# Patient Record
Sex: Male | Born: 1980 | Race: White | Hispanic: No | Marital: Married | Smoking: Current every day smoker
Health system: Southern US, Community
[De-identification: ages and names within clinical notes are randomized; demographics above are authoritative.]

## PROBLEM LIST (undated history)

## (undated) HISTORY — PX: TONSILLECTOMY AND ADENOIDECTOMY: SHX28

---

## 2002-04-14 ENCOUNTER — Emergency Department (HOSPITAL_COMMUNITY): Admission: EM | Admit: 2002-04-14 | Discharge: 2002-04-14 | Payer: Self-pay | Admitting: Emergency Medicine

## 2002-04-14 ENCOUNTER — Emergency Department (HOSPITAL_COMMUNITY): Admission: EM | Admit: 2002-04-14 | Discharge: 2002-04-14 | Payer: Self-pay | Admitting: *Deleted

## 2002-04-14 ENCOUNTER — Encounter: Payer: Self-pay | Admitting: Emergency Medicine

## 2002-04-17 ENCOUNTER — Emergency Department (HOSPITAL_COMMUNITY): Admission: EM | Admit: 2002-04-17 | Discharge: 2002-04-17 | Payer: Self-pay | Admitting: Emergency Medicine

## 2004-05-20 ENCOUNTER — Emergency Department: Payer: Self-pay | Admitting: Emergency Medicine

## 2006-11-23 ENCOUNTER — Emergency Department (HOSPITAL_COMMUNITY): Admission: EM | Admit: 2006-11-23 | Discharge: 2006-11-23 | Payer: Self-pay | Admitting: Emergency Medicine

## 2008-06-17 ENCOUNTER — Emergency Department: Payer: Self-pay | Admitting: Emergency Medicine

## 2013-10-17 ENCOUNTER — Emergency Department: Payer: Self-pay | Admitting: Emergency Medicine

## 2014-03-12 ENCOUNTER — Emergency Department: Payer: Self-pay | Admitting: Emergency Medicine

## 2014-09-01 ENCOUNTER — Emergency Department: Payer: Self-pay | Admitting: Emergency Medicine

## 2014-11-16 ENCOUNTER — Emergency Department: Admit: 2014-11-16 | Discharge status: Against Medical Advice | Payer: Self-pay | Admitting: Emergency Medicine

## 2017-09-19 ENCOUNTER — Emergency Department
Admission: EM | Admit: 2017-09-19 | Discharge: 2017-09-19 | Disposition: A | Discharge status: Home / Self Care | Payer: Self-pay | Attending: Emergency Medicine | Admitting: Emergency Medicine

## 2017-09-19 ENCOUNTER — Encounter: Payer: Self-pay | Admitting: Emergency Medicine

## 2017-09-19 DIAGNOSIS — K0889 Other specified disorders of teeth and supporting structures: Secondary | ICD-10-CM

## 2017-09-19 DIAGNOSIS — F17210 Nicotine dependence, cigarettes, uncomplicated: Secondary | ICD-10-CM | POA: Insufficient documentation

## 2017-09-19 DIAGNOSIS — K047 Periapical abscess without sinus: Secondary | ICD-10-CM | POA: Insufficient documentation

## 2017-09-19 MED ORDER — AMOXICILLIN 500 MG PO CAPS
1000.0000 mg | ORAL_CAPSULE | Freq: Once | ORAL | Status: AC
  Administered 2017-09-19: 1000 mg via ORAL
  Filled 2017-09-19: qty 2

## 2017-09-19 MED ORDER — IBUPROFEN 800 MG PO TABS
800.0000 mg | ORAL_TABLET | Freq: Once | ORAL | Status: AC
Start: 2017-09-19 — End: 2017-09-19
  Administered 2017-09-19: 800 mg via ORAL
  Filled 2017-09-19: qty 1

## 2017-09-19 MED ORDER — IBUPROFEN 600 MG PO TABS: 600.0000 mg | ORAL_TABLET | Freq: Four times a day (QID) | ORAL | 0 refills | Status: AC | PRN

## 2017-09-19 MED ORDER — AMOXICILLIN 875 MG PO TABS: 875.0000 mg | ORAL_TABLET | Freq: Two times a day (BID) | ORAL | 0 refills | Status: AC

## 2017-09-19 MED ORDER — TRAMADOL HCL 50 MG PO TABS: 50.0000 mg | ORAL_TABLET | Freq: Four times a day (QID) | ORAL | 0 refills | Status: AC | PRN

## 2017-09-19 MED ORDER — OXYCODONE-ACETAMINOPHEN 5-325 MG PO TABS
1.0000 | ORAL_TABLET | Freq: Once | ORAL | Status: AC
  Administered 2017-09-19: 1 via ORAL
  Filled 2017-09-19: qty 1

## 2017-09-19 NOTE — Discharge Instructions (Signed)
Please take antibiotics as prescribed.  Take ibuprofen and Tylenol for mild to moderate pain and tramadol as needed for severe pain.  Return to the ED for any fevers increasing pain, drainage worsening symptoms or urgent changes in health.  Please follow-up with dental clinic.

## 2017-09-19 NOTE — ED Provider Notes (Signed)
[facility] EMERGENCY DEPARTMENT Provider Note   CSN: [id number] Arrival date & time: 09/19/17  1315     History   Chief Complaint Chief Complaint  Patient presents with  . Dental Pain    HPI Ronald Stewart is a 37 y.o. male presents to the emergency department for evaluation of dental pain.  Dental pain is been present for 2 days.  No trauma or injury.  Patient states he has a cracked tooth and has a history of dental caries.  Pain and swelling started 2 days ago.  No difficulty swallowing, no fevers.  He has not been on any antibiotics or taking any medication for pain.  Pain is currently moderate.  HPI  History reviewed. No pertinent past medical history.  There are no active problems to display for this patient.   Past Surgical History:  Procedure Laterality Date  . TONSILLECTOMY AND ADENOIDECTOMY         Home Medications    Prior to Admission medications   Medication Sig Start Date End Date Taking? Authorizing Provider  amoxicillin (AMOXIL) 875 MG tablet Take 1 tablet (875 mg total) by mouth 2 (two) times daily. X 10 days 09/19/17   Evon Slack, PA-C  ibuprofen (ADVIL,MOTRIN) 600 MG tablet Take 1 tablet (600 mg total) by mouth every 6 (six) hours as needed for moderate pain. 09/19/17   Evon Slack, PA-C  traMADol (ULTRAM) 50 MG tablet Take 1-2 tablets (50-100 mg total) by mouth every 6 (six) hours as needed. 09/19/17 09/19/18  Evon Slack, PA-C    Family History No family history on file.  Social History Social History   Tobacco Use  . Smoking status: Current Every Day Smoker    Packs/day: 1.00    Types: Cigarettes  . Smokeless tobacco: Never Used  Substance Use Topics  . Alcohol use: No    Frequency: Never  . Drug use: No     Allergies   Patient has no known allergies.   Review of Systems Review of Systems  Constitutional: Negative.  Negative for chills and fever.  HENT: Positive for dental problem and facial  swelling. Negative for drooling, mouth sores, trouble swallowing and voice change.   Respiratory: Negative for shortness of breath.   Cardiovascular: Negative for chest pain.  Gastrointestinal: Negative for nausea and vomiting.  Skin: Negative.   All other systems reviewed and are negative.    Physical Exam Updated Vital Signs BP (!) 146/86 (BP Location: Left Arm)   Pulse (!) 104   Temp 97.8 F (36.6 C) (Oral)   Resp 17   Ht 6\' 4"  (1.93 m)   Wt 79.4 kg (175 lb)   SpO2 98%   BMI 21.30 kg/m   Physical Exam  Constitutional: He is oriented to person, place, and time. He appears well-developed and well-nourished. No distress.  HENT:  Head: Normocephalic and atraumatic.  Right Ear: External ear normal.  Left Ear: External ear normal.  Nose: Nose normal.  Mouth/Throat: Uvula is midline and oropharynx is clear and moist. No oral lesions. No trismus in the jaw. Normal dentition. Dental caries present. No dental abscesses or uvula swelling.    Eyes: EOM are normal.  Neck: Normal range of motion. Neck supple.  Cardiovascular: Normal rate. Exam reveals no gallop and no friction rub.  No murmur heard. Pulmonary/Chest: Effort normal and breath sounds normal. No respiratory distress.  Neurological: He is alert and oriented to person, place, and time.  Skin: Skin  is warm and dry.  Psychiatric: He has a normal mood and affect. His behavior is normal. Thought content normal.     ED Treatments / Results  Labs (all labs ordered are listed, but only abnormal results are displayed) Labs Reviewed - No data to display  EKG  EKG Interpretation None       Radiology No results found.  Procedures Procedures (including critical care time)  Medications Ordered in ED Medications  oxyCODONE-acetaminophen (PERCOCET/ROXICET) 5-325 MG per tablet 1 tablet (1 tablet Oral Given 09/19/17 1431)  ibuprofen (ADVIL,MOTRIN) tablet 800 mg (800 mg Oral Given 09/19/17 1431)  amoxicillin (AMOXIL)  capsule 1,000 mg (1,000 mg Oral Given 09/19/17 1430)     Initial Impression / Assessment and Plan / ED Course  I have reviewed the triage vital signs and the nursing notes.  Pertinent labs & imaging results that were available during my care of the patient were reviewed by me and considered in my medical decision making (see chart for details).     37 year old with dental pain, dental infection.  Started on antibiotics and given tramadol and ibuprofen for pain.  He will follow with dental clinic.  He is educated on signs and symptoms to return to the ED for.  Final Clinical Impressions(s) / ED Diagnoses   Final diagnoses:  Dental infection  Pain, dental    ED Discharge Orders        Ordered    amoxicillin (AMOXIL) 875 MG tablet  2 times daily     09/19/17 1501    traMADol (ULTRAM) 50 MG tablet  Every 6 hours PRN     09/19/17 1501    ibuprofen (ADVIL,MOTRIN) 600 MG tablet  Every 6 hours PRN     09/19/17 1501       Ronnette JuniperGaines, Thomas C, PA-C 09/19/17 1505    Governor RooksLord, Rebecca, MD 09/19/17 1520

## 2017-09-19 NOTE — ED Triage Notes (Signed)
Patient comes into the ED via POV c/o dental pain on the right upper and lower sides x 2 days.  Patient in NAD at this time.

## 2017-09-19 NOTE — ED Notes (Signed)
See triage note  States he is having pain with some jaw swelling to right for last last few days . States he thinks it is his wisdom teeth

## 2021-07-31 ENCOUNTER — Emergency Department: Payer: Self-pay

## 2021-07-31 ENCOUNTER — Encounter: Payer: Self-pay | Admitting: *Deleted

## 2021-07-31 ENCOUNTER — Emergency Department
Admission: EM | Admit: 2021-07-31 | Discharge: 2021-07-31 | Disposition: A | Discharge status: Home / Self Care | Payer: Self-pay | Attending: Emergency Medicine | Admitting: Emergency Medicine

## 2021-07-31 ENCOUNTER — Other Ambulatory Visit: Payer: Self-pay

## 2021-07-31 DIAGNOSIS — R519 Headache, unspecified: Secondary | ICD-10-CM | POA: Insufficient documentation

## 2021-07-31 DIAGNOSIS — Y900 Blood alcohol level of less than 20 mg/100 ml: Secondary | ICD-10-CM | POA: Insufficient documentation

## 2021-07-31 DIAGNOSIS — J690 Pneumonitis due to inhalation of food and vomit: Secondary | ICD-10-CM | POA: Insufficient documentation

## 2021-07-31 DIAGNOSIS — Z20822 Contact with and (suspected) exposure to covid-19: Secondary | ICD-10-CM | POA: Insufficient documentation

## 2021-07-31 DIAGNOSIS — T401X1A Poisoning by heroin, accidental (unintentional), initial encounter: Secondary | ICD-10-CM | POA: Insufficient documentation

## 2021-07-31 LAB — COMPREHENSIVE METABOLIC PANEL
ALT: 23 U/L (ref 0–44)
AST: 41 U/L (ref 15–41)
Albumin: 3.7 g/dL (ref 3.5–5.0)
Alkaline Phosphatase: 51 U/L (ref 38–126)
Anion gap: 10 (ref 5–15)
BUN: 13 mg/dL (ref 6–20)
CO2: 25 mmol/L (ref 22–32)
Calcium: 8.2 mg/dL — ABNORMAL LOW (ref 8.9–10.3)
Chloride: 104 mmol/L (ref 98–111)
Creatinine, Ser: 1.6 mg/dL — ABNORMAL HIGH (ref 0.61–1.24)
GFR, Estimated: 56 mL/min — ABNORMAL LOW (ref >60)
Glucose, Bld: 244 mg/dL — ABNORMAL HIGH (ref 70–99)
Potassium: 3.9 mmol/L (ref 3.5–5.1)
Sodium: 139 mmol/L (ref 135–145)
Total Bilirubin: 1 mg/dL (ref 0.3–1.2)
Total Protein: 5.8 g/dL — ABNORMAL LOW (ref 6.5–8.1)

## 2021-07-31 LAB — CBC WITH DIFFERENTIAL/PLATELET
Abs Immature Granulocytes: 0.52 10*3/uL — ABNORMAL HIGH (ref 0.00–0.07)
Basophils Absolute: 0.1 10*3/uL (ref 0.0–0.1)
Basophils Relative: 0 %
Eosinophils Absolute: 0.1 10*3/uL (ref 0.0–0.5)
Eosinophils Relative: 0 %
HCT: 51 % (ref 39.0–52.0)
Hemoglobin: 17 g/dL (ref 13.0–17.0)
Immature Granulocytes: 2 %
Lymphocytes Relative: 3 %
Lymphs Abs: 1 10*3/uL (ref 0.7–4.0)
MCH: 29.6 pg (ref 26.0–34.0)
MCHC: 33.3 g/dL (ref 30.0–36.0)
MCV: 88.7 fL (ref 80.0–100.0)
Monocytes Absolute: 1.4 10*3/uL — ABNORMAL HIGH (ref 0.1–1.0)
Monocytes Relative: 5 %
Neutro Abs: 27.4 10*3/uL — ABNORMAL HIGH (ref 1.7–7.7)
Neutrophils Relative %: 90 %
Platelets: 199 10*3/uL (ref 150–400)
RBC: 5.75 MIL/uL (ref 4.22–5.81)
RDW: 13.1 % (ref 11.5–15.5)
Smear Review: NORMAL
WBC: 30.6 10*3/uL — ABNORMAL HIGH (ref 4.0–10.5)
nRBC: 0 % (ref 0.0–0.2)

## 2021-07-31 LAB — RESP PANEL BY RT-PCR (FLU A&B, COVID) ARPGX2
Influenza A by PCR: NEGATIVE
Influenza B by PCR: NEGATIVE
SARS Coronavirus 2 by RT PCR: NEGATIVE

## 2021-07-31 LAB — PROCALCITONIN: Procalcitonin: 0.12 ng/mL

## 2021-07-31 LAB — ETHANOL: Alcohol, Ethyl (B): <10 mg/dL (ref <10)

## 2021-07-31 LAB — LACTIC ACID, PLASMA
Lactic Acid, Venous: 1.5 mmol/L (ref 0.5–1.9)
Lactic Acid, Venous: 1.1 mmol/L (ref 0.5–1.9)

## 2021-07-31 MED ORDER — SODIUM CHLORIDE 0.9 % IV BOLUS
1000.0000 mL | Freq: Once | INTRAVENOUS | Status: AC
  Administered 2021-07-31: 1000 mL via INTRAVENOUS

## 2021-07-31 MED ORDER — NALOXONE HCL 4 MG/0.1ML NA LIQD: 1.0000 | Freq: Once | NASAL | 0 refills | Status: AC

## 2021-07-31 MED ORDER — SODIUM CHLORIDE 0.9 % IV SOLN
3.0000 g | Freq: Once | INTRAVENOUS | Status: AC
  Administered 2021-07-31: 3 g via INTRAVENOUS
  Filled 2021-07-31: qty 8

## 2021-07-31 MED ORDER — AMOXICILLIN-POT CLAVULANATE 875-125 MG PO TABS: 1.0000 | ORAL_TABLET | Freq: Two times a day (BID) | ORAL | 0 refills | Status: AC

## 2021-07-31 MED ORDER — ONDANSETRON HCL 4 MG/2ML IJ SOLN
4.0000 mg | Freq: Once | INTRAMUSCULAR | Status: AC
  Administered 2021-07-31: 4 mg via INTRAVENOUS
  Filled 2021-07-31: qty 2

## 2021-07-31 MED ORDER — KETOROLAC TROMETHAMINE 30 MG/ML IJ SOLN
15.0000 mg | Freq: Once | INTRAMUSCULAR | Status: AC
  Administered 2021-07-31: 15 mg via INTRAVENOUS
  Filled 2021-07-31: qty 1

## 2021-07-31 NOTE — Progress Notes (Signed)
PHARMACY -  BRIEF ANTIBIOTIC NOTE   Pharmacy has received consult(s) for Unasyn from an ED provider.  The patient's profile has been reviewed for ht/wt/allergies/indication/available labs.    One time order(s) placed for Unasyn 3 gm for aspiration pneumonia  Further antibiotics/pharmacy consults should be ordered by admitting physician if indicated.                       Thank you, Renda Rolls, PharmD, [facility] 07/31/2021 5:38 AM

## 2021-07-31 NOTE — ED Provider Notes (Signed)
[facility] Provider Note    Event Date/Time   First MD Initiated Contact with Patient 07/31/21 0157     (approximate)   History   Drug Overdose   HPI  Ronald Stewart is a 41 y.o. male brought to the ED via EMS from home status post accidental overdose.  Wife found patient with agonal respirations status post snorting heroin.  Narcan was given on scene with good effect.  Patient arrives to the ED awake, alert, asking for his wife and his mama.  Complains of headache.  Denies chest pain, shortness of breath, abdominal pain, nausea or vomiting.     Past Medical History  No past medical history on file.   Active Problem List  There are no problems to display for this patient.    Past Surgical History   Past Surgical History:  Procedure Laterality Date   TONSILLECTOMY AND ADENOIDECTOMY       Home Medications   Prior to Admission medications   Medication Sig Start Date End Date Taking? Authorizing Provider  amoxicillin-clavulanate (AUGMENTIN) 875-125 MG tablet Take 1 tablet by mouth 2 (two) times daily. 07/31/21  Yes Irean Hong, MD  naloxone [facility]) nasal spray 4 mg/0.1 mL Place 1 spray into the nose once for 1 dose. 07/31/21 07/31/21 Yes Irean Hong, MD  amoxicillin (AMOXIL) 875 MG tablet Take 1 tablet (875 mg total) by mouth 2 (two) times daily. X 10 days 09/19/17   Evon Slack, PA-C  ibuprofen (ADVIL,MOTRIN) 600 MG tablet Take 1 tablet (600 mg total) by mouth every 6 (six) hours as needed for moderate pain. 09/19/17   Evon Slack, PA-C     Allergies  Patient has no known allergies.   Family History  No family history on file.   Physical Exam  Triage Vital Signs: ED Triage Vitals  Enc Vitals Group     BP      Pulse      Resp      Temp      Temp src      SpO2      Weight      Height      Head Circumference      Peak Flow      Pain Score      Pain Loc      Pain Edu?      Excl. in GC?     Updated Vital Signs: BP  109/72    Pulse 83    Temp 98 F (36.7 C) (Oral)    Resp (!) 21    Ht 6\' 4"  (1.93 m)    Wt 77.1 kg    SpO2 99%    BMI 20.69 kg/m    General: Awake, no distress.  CV:  Good peripheral perfusion.  Resp:  Normal effort.  CTA B. Abd:  No distention.  Nontender to palpation. Other:  Awake, alert, conversant.  CN II toXII grossly intact. MAEx4.   ED Results / Procedures / Treatments  Labs (all labs ordered are listed, but only abnormal results are displayed) Labs Reviewed  CBC WITH DIFFERENTIAL/PLATELET - Abnormal; Notable for the following components:      Result Value   WBC 30.6 (*)    Neutro Abs 27.4 (*)    Monocytes Absolute 1.4 (*)    Abs Immature Granulocytes 0.52 (*)    All other components within normal limits  COMPREHENSIVE METABOLIC PANEL - Abnormal; Notable for the following components:  Glucose, Bld 244 (*)    Creatinine, Ser 1.60 (*)    Calcium 8.2 (*)    Total Protein 5.8 (*)    GFR, Estimated 56 (*)    All other components within normal limits  RESP PANEL BY RT-PCR (FLU A&B, COVID) ARPGX2  CULTURE, BLOOD (ROUTINE X 2)  CULTURE, BLOOD (ROUTINE X 2)  ETHANOL  PROCALCITONIN  LACTIC ACID, PLASMA  URINE DRUG SCREEN, QUALITATIVE ([facility] ONLY)  URINALYSIS, ROUTINE W REFLEX MICROSCOPIC  LACTIC ACID, PLASMA     EKG  ED ECG REPORT I, Aniko Finnigan J, the attending physician, personally viewed and interpreted this ECG.   Date: 07/31/2021  EKG Time: 0200  Rate: 102  Rhythm: sinus tachycardia  Axis: Normal  Intervals:none  ST&T Change: Nonspecific    RADIOLOGY I have personally reviewed patient's CT head as well as chest x-ray as well as the radiology interpretation:  Chest x-ray: No acute cardiopulmonary process  CT head: No ICH  Official radiology report(s): CT Head Wo Contrast  Result Date: 07/31/2021 CLINICAL DATA:  Mental status change, unknown cause snorting heroin, overdose, ha EXAM: CT HEAD WITHOUT CONTRAST TECHNIQUE: Contiguous axial images were  obtained from the base of the skull through the vertex without intravenous contrast. RADIATION DOSE REDUCTION: This exam was performed according to the departmental dose-optimization program which includes automated exposure control, adjustment of the mA and/or kV according to patient size and/or use of iterative reconstruction technique. COMPARISON:  None. FINDINGS: Brain: No acute intracranial abnormality. Specifically, no hemorrhage, hydrocephalus, mass lesion, acute infarction, or significant intracranial injury. Vascular: No hyperdense vessel or unexpected calcification. Skull: No acute calvarial abnormality. Sinuses/Orbits: No acute findings Other: None IMPRESSION: No acute intracranial abnormality. Electronically Signed   By: Charlett NoseKevin  Dover M.D.   On: 07/31/2021 03:18   DG Chest Port 1 View  Result Date: 07/31/2021 CLINICAL DATA:  Overdose EXAM: PORTABLE CHEST 1 VIEW COMPARISON:  None. FINDINGS: Heart and mediastinal contours are within normal limits. No focal opacities or effusions. No acute bony abnormality. IMPRESSION: No active disease. Electronically Signed   By: Charlett NoseKevin  Dover M.D.   On: 07/31/2021 02:22     PROCEDURES:  Critical Care performed: No  .1-3 Lead EKG Interpretation Performed by: Irean HongSung, Ryken Paschal J, MD Authorized by: Irean HongSung, Caylon Saine J, MD     Interpretation: abnormal     ECG rate:  102   ECG rate assessment: tachycardic     Rhythm: sinus tachycardia     Ectopy: none     Conduction: normal   Comments:     Patient placed on cardiac monitor to evaluate for arrhythmias   MEDICATIONS ORDERED IN ED: Medications  sodium chloride 0.9 % bolus 1,000 mL (1,000 mLs Intravenous Bolus 07/31/21 0236)  sodium chloride 0.9 % bolus 1,000 mL (1,000 mLs Intravenous Bolus 07/31/21 0350)  ketorolac (TORADOL) 30 MG/ML injection 15 mg (15 mg Intravenous Given 07/31/21 0408)  ondansetron (ZOFRAN) injection 4 mg (4 mg Intravenous Given 07/31/21 0408)  Ampicillin-Sulbactam (UNASYN) 3 g in sodium chloride  0.9 % 100 mL IVPB (0 g Intravenous Stopped 07/31/21 0619)     IMPRESSION / MDM / ASSESSMENT AND PLAN / ED COURSE  I reviewed the triage vital signs and the nursing notes.                             41 year old male brought to the ED by EMS status post accidental overdose on heroin. Differential diagnosis includes, but is not  limited to, alcohol, illicit or prescription medications, or other toxic ingestion; intracranial pathology such as stroke or intracerebral hemorrhage; fever or infectious causes including sepsis; hypoxemia and/or hypercarbia; uremia; trauma; endocrine related disorders such as diabetes, hypoglycemia, and thyroid-related diseases; hypertensive encephalopathy; etc.   Patient is awake and alert status post Narcan given at the scene.  Will obtain toxicological lab work, CT head given patient's headache.  Initiate IV fluids and monitor in the emergency department.  I have personally reviewed patient's chart and see mostly ED visits for minor care issues.  The patient is on the cardiac monitor to evaluate for evidence of arrhythmia and/or significant heart rate changes.   Clinical Course as of 07/31/21 0648  Thu Jul 31, 2021  0335 Patient resting in no acute distress.  Awake, alert and visiting with his wife and mother.  Leukocytosis noted.  Patient does not give infectious history.  May be secondary to stress reaction but will check blood cultures, lactic acid, respiratory panel and procalcitonin.  We will continue to monitor. [JS]  0524 Lactic acid and procalcitonin unremarkable.  However, will cover with IV Unasyn empirically and discharged home on Augmentin for possible aspiration pneumonia.  Patient did urinate but flushed it without collecting a specimen.  Will discharge home after completion of IV antibiotic.  Strict return precautions given.  Patient and his family members verbalized understanding agree with plan of care [JS]    Clinical Course User Index [JS] Irean Hong, MD     FINAL CLINICAL IMPRESSION(S) / ED DIAGNOSES   Final diagnoses:  Accidental overdose of heroin, initial encounter [facility])  Aspiration pneumonia, unspecified aspiration pneumonia type, unspecified laterality, unspecified part of lung (HCC)     Rx / DC Orders   ED Discharge Orders          Ordered    amoxicillin-clavulanate (AUGMENTIN) 875-125 MG tablet  2 times daily        07/31/21 0526    naloxone (NARCAN) nasal spray 4 mg/0.1 mL   Once        07/31/21 3845             Note:  This document was prepared using Dragon voice recognition software and may include unintentional dictation errors.   Irean Hong, MD 07/31/21 [phone]

## 2021-07-31 NOTE — ED Triage Notes (Signed)
Pt brought in via ems from home after heroin overdose.  Wife found pt, called 911.  2mg  narcan intranasally given by fire dept    pt awake on arrival  iv in place  md at bedside

## 2021-07-31 NOTE — Discharge Instructions (Addendum)
1.  Take antibiotic as prescribed (Augmentin 875 mg twice daily x7 days). 2.  Administer Narcan in case of overdose. 3.  Return to the ER for worsening symptoms, persistent vomiting, difficulty breathing or other concerns.

## 2021-08-05 LAB — CULTURE, BLOOD (ROUTINE X 2)
Culture: NO GROWTH
Culture: NO GROWTH
Special Requests: ADEQUATE
Special Requests: ADEQUATE

## 2022-12-04 ENCOUNTER — Emergency Department: Payer: Medicaid Other

## 2022-12-04 ENCOUNTER — Other Ambulatory Visit: Payer: Self-pay

## 2022-12-04 ENCOUNTER — Emergency Department
Admission: EM | Admit: 2022-12-04 | Discharge: 2022-12-04 | Disposition: A | Discharge status: Home / Self Care | Payer: Medicaid Other | Attending: Emergency Medicine | Admitting: Emergency Medicine

## 2022-12-04 DIAGNOSIS — D72829 Elevated white blood cell count, unspecified: Secondary | ICD-10-CM | POA: Insufficient documentation

## 2022-12-04 DIAGNOSIS — N200 Calculus of kidney: Secondary | ICD-10-CM

## 2022-12-04 DIAGNOSIS — N132 Hydronephrosis with renal and ureteral calculous obstruction: Secondary | ICD-10-CM | POA: Insufficient documentation

## 2022-12-04 LAB — CBC
HCT: 46.6 % (ref 39.0–52.0)
Hemoglobin: 15.6 g/dL (ref 13.0–17.0)
MCH: 27.9 pg (ref 26.0–34.0)
MCHC: 33.5 g/dL (ref 30.0–36.0)
MCV: 83.4 fL (ref 80.0–100.0)
Platelets: 253 10*3/uL (ref 150–400)
RBC: 5.59 MIL/uL (ref 4.22–5.81)
RDW: 12.1 % (ref 11.5–15.5)
WBC: 15 10*3/uL — ABNORMAL HIGH (ref 4.0–10.5)
nRBC: 0 % (ref 0.0–0.2)

## 2022-12-04 LAB — URINALYSIS, ROUTINE W REFLEX MICROSCOPIC
Bacteria, UA: NONE SEEN
Bilirubin Urine: NEGATIVE
Glucose, UA: NEGATIVE mg/dL
Ketones, ur: NEGATIVE mg/dL
Nitrite: NEGATIVE
Protein, ur: NEGATIVE mg/dL
Specific Gravity, Urine: 1.028 (ref 1.005–1.030)
pH: 5 (ref 5.0–8.0)

## 2022-12-04 LAB — BASIC METABOLIC PANEL
Anion gap: 10 (ref 5–15)
BUN: 12 mg/dL (ref 6–20)
CO2: 26 mmol/L (ref 22–32)
Calcium: 9.3 mg/dL (ref 8.9–10.3)
Chloride: 102 mmol/L (ref 98–111)
Creatinine, Ser: 1.15 mg/dL (ref 0.61–1.24)
GFR, Estimated: >60 mL/min (ref >60)
Glucose, Bld: 102 mg/dL — ABNORMAL HIGH (ref 70–99)
Potassium: 3.9 mmol/L (ref 3.5–5.1)
Sodium: 138 mmol/L (ref 135–145)

## 2022-12-04 MED ORDER — OXYCODONE-ACETAMINOPHEN 5-325 MG PO TABS: 1.0000 | ORAL_TABLET | Freq: Four times a day (QID) | ORAL | 0 refills | Status: AC | PRN

## 2022-12-04 MED ORDER — CEPHALEXIN 500 MG PO CAPS: 500.0000 mg | ORAL_CAPSULE | Freq: Four times a day (QID) | ORAL | 0 refills | Status: AC

## 2022-12-04 MED ORDER — OXYCODONE-ACETAMINOPHEN 5-325 MG PO TABS
1.0000 | ORAL_TABLET | Freq: Once | ORAL | Status: AC
  Administered 2022-12-04: 1 via ORAL
  Filled 2022-12-04: qty 1

## 2022-12-04 MED ORDER — TAMSULOSIN HCL 0.4 MG PO CAPS: 0.4000 mg | ORAL_CAPSULE | Freq: Every day | ORAL | 0 refills | Status: AC

## 2022-12-04 MED ORDER — SODIUM CHLORIDE 0.9 % IV SOLN: 1.0000 g | Freq: Once | INTRAVENOUS | Status: DC

## 2022-12-04 MED ORDER — CEFTRIAXONE SODIUM 1 G IJ SOLR
1.0000 g | Freq: Once | INTRAMUSCULAR | Status: AC
  Administered 2022-12-04: 1 g via INTRAMUSCULAR
  Filled 2022-12-04: qty 10

## 2022-12-04 MED ORDER — ONDANSETRON 4 MG PO TBDP: 4.0000 mg | ORAL_TABLET | Freq: Three times a day (TID) | ORAL | 0 refills | Status: AC | PRN

## 2022-12-04 MED ORDER — ONDANSETRON 4 MG PO TBDP
4.0000 mg | ORAL_TABLET | Freq: Once | ORAL | Status: AC
  Administered 2022-12-04: 4 mg via ORAL
  Filled 2022-12-04: qty 1

## 2022-12-04 NOTE — ED Notes (Signed)
Pt verbalizes understanding of discharge instructions. Opportunity for questioning and answers were provided. Pt discharged from ED to home with mother.   ? ?

## 2022-12-04 NOTE — ED Triage Notes (Signed)
Pt to ED for R flank pain since 1.5 hour ago which started in R testicle. Pt denies pain, burning with urination. Urine sample obtained (scant). Denies swelling or cyanosis to testicle. Appears to be in severe pain.

## 2022-12-04 NOTE — Discharge Instructions (Signed)
You have been prescribed Percocet for pain. You can pair Percocet with Zofran in order to avoid nausea. Take Keflex four times daily for the next seven days.

## 2022-12-04 NOTE — ED Provider Notes (Signed)
[facility] Provider Note  Patient Contact: 3:07 PM (approximate)   History   Flank Pain   HPI  Ronald Stewart is a 42 y.o. male presents to the emergency department with right-sided flank pain that radiates to the scrotum that started 2 to 3 hours ago.  Patient has had some associated nausea but no vomiting.  He denies history of nephrolithiasis.  He states that he has had some decreased urinary frequency but no dysuria.  No fever or chills at home.  No abdominal pain.      Physical Exam   Triage Vital Signs: ED Triage Vitals  Enc Vitals Group     BP 12/04/22 1243 (!) 135/95     Pulse Rate 12/04/22 1243 82     Resp 12/04/22 1243 20     Temp 12/04/22 1243 97.8 F (36.6 C)     Temp Source 12/04/22 1243 Oral     SpO2 12/04/22 1243 100 %     Weight 12/04/22 1245 170 lb (77.1 kg)     Height 12/04/22 1245 6\' 4"  (1.93 m)     Head Circumference --      Peak Flow --      Pain Score 12/04/22 1245 10     Pain Loc --      Pain Edu? --      Excl. in GC? --     Most recent vital signs: Vitals:   12/04/22 1243  BP: (!) 135/95  Pulse: 82  Resp: 20  Temp: 97.8 F (36.6 C)  SpO2: 100%     General: Alert and in no acute distress. Eyes:  PERRL. EOMI. Head: No acute traumatic findings ENT:      Nose: No congestion/rhinnorhea.      Mouth/Throat: Mucous membranes are moist. Neck: No stridor. No cervical spine tenderness to palpation.  Cardiovascular:  Good peripheral perfusion Respiratory: Normal respiratory effort without tachypnea or retractions. Lungs CTAB. Good air entry to the bases with no decreased or absent breath sounds. Gastrointestinal: Bowel sounds 4 quadrants. Soft and nontender to palpation. No guarding or rigidity. No palpable masses. No distention. No CVA tenderness. Genitourinary: Patient has right sided CVA tenderness.  Musculoskeletal: Full range of motion to all extremities.  Neurologic:  No gross focal neurologic deficits are  appreciated.  Skin:   No rash noted Other:   ED Results / Procedures / Treatments   Labs (all labs ordered are listed, but only abnormal results are displayed) Labs Reviewed  URINALYSIS, ROUTINE W REFLEX MICROSCOPIC - Abnormal; Notable for the following components:      Result Value   Color, Urine YELLOW (*)    APPearance CLEAR (*)    Hgb urine dipstick MODERATE (*)    Leukocytes,Ua TRACE (*)    All other components within normal limits  BASIC METABOLIC PANEL - Abnormal; Notable for the following components:   Glucose, Bld 102 (*)    All other components within normal limits  CBC - Abnormal; Notable for the following components:   WBC 15.0 (*)    All other components within normal limits        RADIOLOGY  I personally viewed and evaluated these images as part of my medical decision making, as well as reviewing the written report by the radiologist.  ED Provider Interpretation: 2 mm calculus in distal right ureter, likely renal stone.   PROCEDURES:  Critical Care performed: No  Procedures   MEDICATIONS ORDERED IN ED: Medications  cefTRIAXone (ROCEPHIN)  injection 1 g (has no administration in time range)  oxyCODONE-acetaminophen (PERCOCET/ROXICET) 5-325 MG per tablet 1 tablet (1 tablet Oral Given 12/04/22 1524)  ondansetron (ZOFRAN-ODT) disintegrating tablet 4 mg (4 mg Oral Given 12/04/22 1524)     IMPRESSION / MDM / ASSESSMENT AND PLAN / ED COURSE  I reviewed the triage vital signs and the nursing notes.                              Assessment and plan: Flank pain: 42 year old male with a largely unremarkable past medical history presents to the emergency department with right-sided flank pain that started 2 to 3 hours ago.  Patient states that pain radiates to scrotum.  Vital signs were reassuring at triage.  On exam, patient was alert and nontoxic-appearing.  He did have right-sided CVA tenderness on exam.  Patient had mildly elevated white blood cell count  at 15.  Urinalysis indicates a moderate amount of blood and 6-10 white blood cells.  No nitrites or bacteria identified.  BMP reassuring.  Will obtain CT renal stone study and will reassess.  Patient given Percocet in the emergency department for pain.    Patient had 6-10 white blood cells present on urinalysis.  Given stranding around right ureter, will cover patient for an early pyelonephritis with Rocephin and Keflex at discharge.  Return precautions were given to return with new or worsening symptoms.  All patient questions were answered.  FINAL CLINICAL IMPRESSION(S) / ED DIAGNOSES   Final diagnoses:  Nephrolithiasis     Rx / DC Orders   ED Discharge Orders          Ordered    tamsulosin (FLOMAX) 0.4 MG CAPS capsule  Daily        12/04/22 1557    oxyCODONE-acetaminophen (PERCOCET/ROXICET) 5-325 MG tablet  Every 6 hours PRN        12/04/22 1557    ondansetron (ZOFRAN-ODT) 4 MG disintegrating tablet  Every 8 hours PRN        12/04/22 1557    cephALEXin (KEFLEX) 500 MG capsule  4 times daily        12/04/22 1557             Note:  This document was prepared using Dragon voice recognition software and may include unintentional dictation errors.   Pia Mau Anderson, PA-C 12/04/22 1612    Pilar Jarvis, MD 12/09/22 [phone]

## 2022-12-04 NOTE — ED Notes (Signed)
Pt gone to CT 

## 2023-01-20 IMAGING — DX DG CHEST 1V PORT
1 series · 1 of 1 positions shown · non-contrast
Comparison: None.

CLINICAL DATA: Overdose

EXAM:
PORTABLE CHEST 1 VIEW

[chest ap]
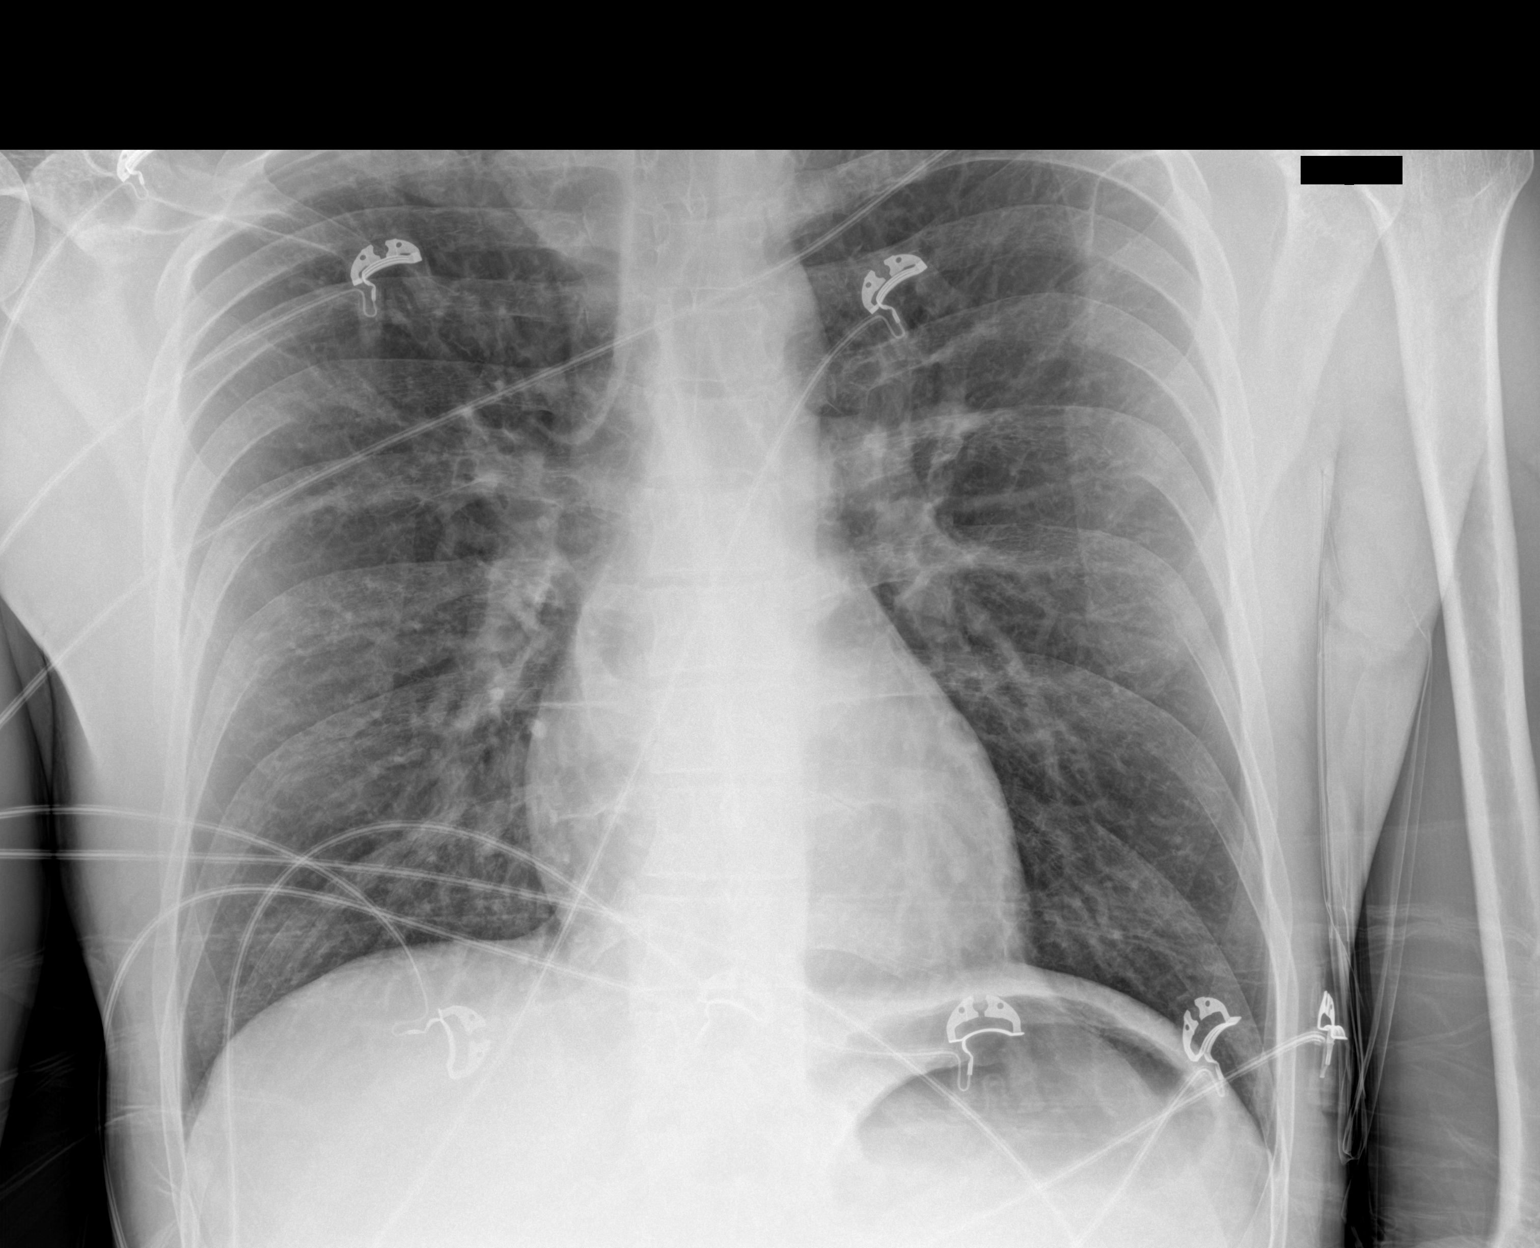

[1 of 1 positions shown; findings below may reference images not displayed]

FINDINGS: Heart and mediastinal contours are within normal limits. No focal
opacities or effusions. No acute bony abnormality.
IMPRESSION: No active disease.

## 2024-01-04 NOTE — Progress Notes (Signed)
 Created message in error.
